# Patient Record
Sex: Male | Born: 1990 | Race: White | Hispanic: No | Marital: Single | Smoking: Never smoker
Health system: Southern US, Community
[De-identification: ages and names within clinical notes are randomized; demographics above are authoritative.]

## PROBLEM LIST (undated history)

## (undated) DIAGNOSIS — T7840XA Allergy, unspecified, initial encounter: Secondary | ICD-10-CM

## (undated) DIAGNOSIS — J45909 Unspecified asthma, uncomplicated: Secondary | ICD-10-CM

## (undated) DIAGNOSIS — H269 Unspecified cataract: Secondary | ICD-10-CM

## (undated) HISTORY — DX: Allergy, unspecified, initial encounter: T78.40XA

## (undated) HISTORY — DX: Unspecified cataract: H26.9

## (undated) HISTORY — PX: APPENDECTOMY: SHX54

---

## 2009-08-11 ENCOUNTER — Emergency Department (HOSPITAL_COMMUNITY): Admission: EM | Admit: 2009-08-11 | Discharge: 2009-08-12 | Payer: Self-pay | Admitting: Emergency Medicine

## 2013-10-11 ENCOUNTER — Encounter (HOSPITAL_COMMUNITY): Payer: Self-pay | Admitting: Emergency Medicine

## 2013-10-11 ENCOUNTER — Emergency Department (HOSPITAL_COMMUNITY)
Admission: EM | Admit: 2013-10-11 | Discharge: 2013-10-11 | Disposition: A | Discharge status: Home / Self Care | Payer: Worker's Compensation | Attending: Emergency Medicine | Admitting: Emergency Medicine

## 2013-10-11 DIAGNOSIS — S0093XA Contusion of unspecified part of head, initial encounter: Secondary | ICD-10-CM

## 2013-10-11 DIAGNOSIS — Y9389 Activity, other specified: Secondary | ICD-10-CM | POA: Diagnosis not present

## 2013-10-11 DIAGNOSIS — Z79899 Other long term (current) drug therapy: Secondary | ICD-10-CM | POA: Insufficient documentation

## 2013-10-11 DIAGNOSIS — J45909 Unspecified asthma, uncomplicated: Secondary | ICD-10-CM | POA: Diagnosis not present

## 2013-10-11 DIAGNOSIS — S0990XA Unspecified injury of head, initial encounter: Secondary | ICD-10-CM | POA: Insufficient documentation

## 2013-10-11 DIAGNOSIS — S0083XA Contusion of other part of head, initial encounter: Secondary | ICD-10-CM | POA: Insufficient documentation

## 2013-10-11 DIAGNOSIS — S1093XA Contusion of unspecified part of neck, initial encounter: Secondary | ICD-10-CM

## 2013-10-11 DIAGNOSIS — S0003XA Contusion of scalp, initial encounter: Secondary | ICD-10-CM | POA: Diagnosis not present

## 2013-10-11 DIAGNOSIS — S060X0A Concussion without loss of consciousness, initial encounter: Secondary | ICD-10-CM | POA: Insufficient documentation

## 2013-10-11 DIAGNOSIS — Y929 Unspecified place or not applicable: Secondary | ICD-10-CM | POA: Diagnosis not present

## 2013-10-11 DIAGNOSIS — IMO0002 Reserved for concepts with insufficient information to code with codable children: Secondary | ICD-10-CM | POA: Insufficient documentation

## 2013-10-11 DIAGNOSIS — G44319 Acute post-traumatic headache, not intractable: Secondary | ICD-10-CM

## 2013-10-11 HISTORY — DX: Unspecified asthma, uncomplicated: J45.909

## 2013-10-11 MED ORDER — IBUPROFEN 400 MG PO TABS
800.0000 mg | ORAL_TABLET | Freq: Once | ORAL | Status: AC
  Administered 2013-10-11: 800 mg via ORAL
  Filled 2013-10-11: qty 2

## 2013-10-11 MED ORDER — IBUPROFEN 800 MG PO TABS: 800.0000 mg | ORAL_TABLET | Freq: Three times a day (TID) | ORAL | Status: DC

## 2013-10-11 NOTE — ED Provider Notes (Signed)
CSN: [id number]     Arrival date & time 10/11/13  1921 History  This chart was scribed for non-physician practitioner, Dierdre Forth PA-C, working with Toy Baker, MD by Milly Jakob, ED Scribe. The patient was seen in room TR09C/TR09C. Patient's care was started at 8:20 PM.   Chief Complaint  Patient presents with  . Head Injury   The history is provided by the patient and medical records. No language interpreter was used.   HPI Comments: Don Obrien is a 23 y.o. male who presents to the Emergency Department complaining of a head injury earlier tonight. He states that he was working on a car, and he stood up and slammed his head into the spoiler. He states that it knocked him to the ground, but he did not pass out. He reports that he has pain at the site of the injury that a headache that radiates across his head. He reports that he feels "cloudy" but has had no memory loss.  He denies neck pain, blurry vision, numbness or tingling. He did not take any medication for his pain. He reports that he has a high pain tolerance. He denies any medical problems or taking any medications.   Past Medical History  Diagnosis Date  . Asthma     as child   Past Surgical History  Procedure Laterality Date  . Appendectomy     No family history on file. History  Substance Use Topics  . Smoking status: Never Smoker   . Smokeless tobacco: Not on file  . Alcohol Use: Yes     Comment: occasionally    Review of Systems  Constitutional: Negative for fever, diaphoresis, appetite change, fatigue and unexpected weight change.  HENT: Negative for mouth sores.   Eyes: Negative for visual disturbance.  Respiratory: Negative for cough, chest tightness, shortness of breath and wheezing.   Cardiovascular: Negative for chest pain.  Gastrointestinal: Negative for nausea, vomiting, abdominal pain, diarrhea and constipation.  Endocrine: Negative for polydipsia, polyphagia and polyuria.   Genitourinary: Negative for dysuria, urgency, frequency and hematuria.  Musculoskeletal: Negative for back pain, neck pain and neck stiffness.  Skin: Negative for rash.  Allergic/Immunologic: Negative for immunocompromised state.  Neurological: Positive for headaches. Negative for syncope and light-headedness.  Hematological: Does not bruise/bleed easily.  Psychiatric/Behavioral: Negative for sleep disturbance. The patient is not nervous/anxious.    Allergies  Review of patient's allergies indicates no known allergies.  Home Medications   Prior to Admission medications   Medication Sig Start Date End Date Taking? Authorizing Provider  ibuprofen (ADVIL,MOTRIN) 800 MG tablet Take 1 tablet (800 mg total) by mouth 3 (three) times daily. 10/11/13   Nadeen Shipman, PA-C   Triage Vitals: BP 131/77  Pulse 65  Temp(Src) 97.6 F (36.4 C) (Oral)  Resp 18  Ht 6' (1.829 m)  Wt 158 lb (71.668 kg)  BMI 21.42 kg/m2  SpO2 97% Physical Exam  Nursing note and vitals reviewed. Constitutional: He is oriented to person, place, and time. He appears well-developed and well-nourished. No distress.  HENT:  Head: Normocephalic and atraumatic.  Right Ear: Tympanic membrane, external ear and ear canal normal.  Left Ear: Tympanic membrane, external ear and ear canal normal.  Nose: Nose normal. No epistaxis. Right sinus exhibits no maxillary sinus tenderness and no frontal sinus tenderness. Left sinus exhibits no maxillary sinus tenderness and no frontal sinus tenderness.  Mouth/Throat: Uvula is midline, oropharynx is clear and moist and mucous membranes are normal. Mucous membranes are  not pale and not cyanotic. No oropharyngeal exudate, posterior oropharyngeal edema, posterior oropharyngeal erythema or tonsillar abscesses.  4CM linear contusion to the right forehead at the hairline. No crepitus, deformity, or laceration.   Eyes: Conjunctivae and EOM are normal. Pupils are equal, round, and reactive to  light. No scleral icterus.  No horizontal, vertical or rotational nystagmus. Anisocoria present w/ briskly reactive pupils. EOMI w/o diplopia.   Neck: Normal range of motion and full passive range of motion without pain. Neck supple.  Full active and passive ROM without pain No midline or paraspinal tenderness No nuchal rigidity or meningeal signs  Cardiovascular: Normal rate, regular rhythm, normal heart sounds and intact distal pulses.   No murmur heard. Pulmonary/Chest: Effort normal and breath sounds normal. No stridor. No respiratory distress. He has no wheezes. He has no rales.  Clear and equal breath sounds without focal wheezes, rhonchi, rales  Abdominal: Soft. Bowel sounds are normal. There is no tenderness. There is no rebound and no guarding.  Musculoskeletal: Normal range of motion. He exhibits no edema.  Lymphadenopathy:    He has no cervical adenopathy.  Neurological: He is alert and oriented to person, place, and time. He has normal reflexes. No cranial nerve deficit. He exhibits normal muscle tone. Coordination normal.  Mental Status:  Alert, oriented, thought content appropriate. Speech fluent without evidence of aphasia. Able to follow 2 step commands without difficulty.  Cranial Nerves:  II:  Peripheral visual fields grossly normal, pupils equal, round, reactive to light III,IV, VI: ptosis not present, extra-ocular motions intact bilaterally  V,VII: smile symmetric, facial light touch sensation equal VIII: hearing grossly normal bilaterally  IX,X: gag reflex present  XI: bilateral shoulder shrug equal and strong XII: midline tongue extension  Motor:  5/5 in upper and lower extremities bilaterally including strong and equal grip strength and dorsiflexion/plantar flexion Sensory: Pinprick and light touch normal in all extremities.  Deep Tendon Reflexes: 2+ and symmetric  Cerebellar: normal finger-to-nose with bilateral upper extremities Gait: normal gait and  balance CV: distal pulses palpable throughout   Skin: Skin is warm and dry. No rash noted. He is not diaphoretic.  Psychiatric: He has a normal mood and affect. His behavior is normal. Judgment and thought content normal.    ED Course  Procedures (including critical care time) DIAGNOSTIC STUDIES: Oxygen Saturation is 97% on room air, normal by my interpretation.    COORDINATION OF CARE: 8:27 PM-Discussed treatment plan which includes NSAIDs, and rest from contact sports and electronic devices with pt at bedside and pt agreed to plan.    Labs Review Labs Reviewed - No data to display  Imaging Review No results found.   EKG Interpretation None      MDM   Final diagnoses:  Contusion of head, initial encounter  Acute post-traumatic headache, not intractable  Concussion, without loss of consciousness, initial encounter   Don Obrien presents with headache persistent after hitting his head on the spoiler of a car approximately 4 PM today.    Patient with head injury which did not cause of loss of consciousness but with persistent headache since the initial trauma.  No evidence of skull fracture on physical exam. Patient is not taking anticoagulants, is less than 65 and has no history of subarachnoid or subdural hemorrhage. Patient denies nausea, vomiting, amnesia, vision changes,cognitive or memory dysfunction and vertigo.    Patient with no focal neurological deficits on physical exam.  Discussed thoroughly symptoms to return to the emergency department including  severe headaches, disequilibrium, vomiting, double vision, extremity weakness, difficulty ambulating, or any other concerning symptoms.  Discussed the likely etiology of patient's symptoms being concussion/postconcussive syndrome and patient agrees that CT is not indicated at this time.  Patient will be discharged with information pertaining to diagnosis and advised to use over-the-counter medications like NSAIDs and Tylenol  for pain relief. Pt has also advised to not participate in contact sports until they are completely asymptomatic for at least 1 week or they are cleared by their doctor.   I have personally reviewed patient's vitals, nursing note and any pertinent labs or imaging.  I performed an undressed physical exam.    At this time, it has been determined that no acute conditions requiring further emergency intervention. The patient/guardian have been advised of the diagnosis and plan. I reviewed all labs and imaging including any potential incidental findings. We have discussed signs and symptoms that warrant return to the ED, such as those listed above.  Patient/guardian has voiced understanding and agreed to follow-up with the PCP or specialist in 3 days.  Vital signs are stable at discharge.   BP 132/74  Pulse 59  Temp(Src) 97.6 F (36.4 C) (Oral)  Resp 12  Ht 6' (1.829 m)  Wt 158 lb (71.668 kg)  BMI 21.42 kg/m2  SpO2 100%  I personally performed the services described in this documentation, which was scribed in my presence. The recorded information has been reviewed and is accurate.          Dahlia ClientHannah Rakhi Romagnoli, PA-C 10/11/13 2153

## 2013-10-11 NOTE — ED Notes (Signed)
Pt reports he was at work and was under car, and hit R side of head on spoiler when he jumped up; denies LOC

## 2013-10-11 NOTE — Discharge Instructions (Signed)
1. Medications: ibuprofen, usual home medications 2. Treatment: rest, drink plenty of fluids,  3. Follow Up: Please followup with your primary doctor for discussion of your diagnoses and further evaluation after today's visit; if you do not have a primary care doctor use the resource guide provided to find one;    Concussion A concussion, or closed-head injury, is a brain injury caused by a direct blow to the head or by a quick and sudden movement (jolt) of the head or neck. Concussions are usually not life-threatening. Even so, the effects of a concussion can be serious. If you have had a concussion before, you are more likely to experience concussion-like symptoms after a direct blow to the head.  CAUSES  Direct blow to the head, such as from running into another player during a soccer game, being hit in a fight, or hitting your head on a hard surface.  A jolt of the head or neck that causes the brain to move back and forth inside the skull, such as in a car crash. SIGNS AND SYMPTOMS The signs of a concussion can be hard to notice. Early on, they may be missed by you, family members, and health care providers. You may look fine but act or feel differently. Symptoms are usually temporary, but they may last for days, weeks, or even longer. Some symptoms may appear right away while others may not show up for hours or days. Every head injury is different. Symptoms include:  Mild to moderate headaches that will not go away.  A feeling of pressure inside your head.  Having more trouble than usual:  Learning or remembering things you have heard.  Answering questions.  Paying attention or concentrating.  Organizing daily tasks.  Making decisions and solving problems.  Slowness in thinking, acting or reacting, speaking, or reading.  Getting lost or being easily confused.  Feeling tired all the time or lacking energy (fatigued).  Feeling drowsy.  Sleep disturbances.  Sleeping more  than usual.  Sleeping less than usual.  Trouble falling asleep.  Trouble sleeping (insomnia).  Loss of balance or feeling lightheaded or dizzy.  Nausea or vomiting.  Numbness or tingling.  Increased sensitivity to:  Sounds.  Lights.  Distractions.  Vision problems or eyes that tire easily.  Diminished sense of taste or smell.  Ringing in the ears.  Mood changes such as feeling sad or anxious.  Becoming easily irritated or angry for little or no reason.  Lack of motivation.  Seeing or hearing things other people do not see or hear (hallucinations). DIAGNOSIS Your health care provider can usually diagnose a concussion based on a description of your injury and symptoms. He or she will ask whether you passed out (lost consciousness) and whether you are having trouble remembering events that happened right before and during your injury. Your evaluation might include:  A brain scan to look for signs of injury to the brain. Even if the test shows no injury, you may still have a concussion.  Blood tests to be sure other problems are not present. TREATMENT  Concussions are usually treated in an emergency department, in urgent care, or at a clinic. You may need to stay in the hospital overnight for further treatment.  Tell your health care provider if you are taking any medicines, including prescription medicines, over-the-counter medicines, and natural remedies. Some medicines, such as blood thinners (anticoagulants) and aspirin, may increase the chance of complications. Also tell your health care provider whether you have had  alcohol or are taking illegal drugs. This information may affect treatment.  Your health care provider will send you home with important instructions to follow.  How fast you will recover from a concussion depends on many factors. These factors include how severe your concussion is, what part of your brain was injured, your age, and how healthy you were  before the concussion.  Most people with mild injuries recover fully. Recovery can take time. In general, recovery is slower in older persons. Also, persons who have had a concussion in the past or have other medical problems may find that it takes longer to recover from their current injury. HOME CARE INSTRUCTIONS General Instructions  Carefully follow the directions your health care provider gave you.  Only take over-the-counter or prescription medicines for pain, discomfort, or fever as directed by your health care provider.  Take only those medicines that your health care provider has approved.  Do not drink alcohol until your health care provider says you are well enough to do so. Alcohol and certain other drugs may slow your recovery and can put you at risk of further injury.  If it is harder than usual to remember things, write them down.  If you are easily distracted, try to do one thing at a time. For example, do not try to watch TV while fixing dinner.  Talk with family members or close friends when making important decisions.  Keep all follow-up appointments. Repeated evaluation of your symptoms is recommended for your recovery.  Watch your symptoms and tell others to do the same. Complications sometimes occur after a concussion. Older adults with a brain injury may have a higher risk of serious complications, such as a blood clot on the brain.  Tell your teachers, school nurse, school counselor, coach, athletic trainer, or work Production designer, theatre/television/film about your injury, symptoms, and restrictions. Tell them about what you can or cannot do. They should watch for:  Increased problems with attention or concentration.  Increased difficulty remembering or learning new information.  Increased time needed to complete tasks or assignments.  Increased irritability or decreased ability to cope with stress.  Increased symptoms.  Rest. Rest helps the brain to heal. Make sure you:  Get plenty of  sleep at night. Avoid staying up late at night.  Keep the same bedtime hours on weekends and weekdays.  Rest during the day. Take daytime naps or rest breaks when you feel tired.  Limit activities that require a lot of thought or concentration. These include:  Doing homework or job-related work.  Watching TV.  Working on the computer.  Avoid any situation where there is potential for another head injury (football, hockey, soccer, basketball, martial arts, downhill snow sports and horseback riding). Your condition will get worse every time you experience a concussion. You should avoid these activities until you are evaluated by the appropriate follow-up health care providers. Returning To Your Regular Activities You will need to return to your normal activities slowly, not all at once. You must give your body and brain enough time for recovery.  Do not return to sports or other athletic activities until your health care provider tells you it is safe to do so.  Ask your health care provider when you can drive, ride a bicycle, or operate heavy machinery. Your ability to react may be slower after a brain injury. Never do these activities if you are dizzy.  Ask your health care provider about when you can return to work or school.  Preventing Another Concussion It is very important to avoid another brain injury, especially before you have recovered. In rare cases, another injury can lead to permanent brain damage, brain swelling, or death. The risk of this is greatest during the first 7-10 days after a head injury. Avoid injuries by:  Wearing a seat belt when riding in a car.  Drinking alcohol only in moderation.  Wearing a helmet when biking, skiing, skateboarding, skating, or doing similar activities.  Avoiding activities that could lead to a second concussion, such as contact or recreational sports, until your health care provider says it is okay.  Taking safety measures in your  home.  Remove clutter and tripping hazards from floors and stairways.  Use grab bars in bathrooms and handrails by stairs.  Place non-slip mats on floors and in bathtubs.  Improve lighting in dim areas. SEEK MEDICAL CARE IF:  You have increased problems paying attention or concentrating.  You have increased difficulty remembering or learning new information.  You need more time to complete tasks or assignments than before.  You have increased irritability or decreased ability to cope with stress.  You have more symptoms than before. Seek medical care if you have any of the following symptoms for more than 2 weeks after your injury:  Lasting (chronic) headaches.  Dizziness or balance problems.  Nausea.  Vision problems.  Increased sensitivity to noise or light.  Depression or mood swings.  Anxiety or irritability.  Memory problems.  Difficulty concentrating or paying attention.  Sleep problems.  Feeling tired all the time. SEEK IMMEDIATE MEDICAL CARE IF:  You have severe or worsening headaches. These may be a sign of a blood clot in the brain.  You have weakness (even if only in one hand, leg, or part of the face).  You have numbness.  You have decreased coordination.  You vomit repeatedly.  You have increased sleepiness.  One pupil is larger than the other.  You have convulsions.  You have slurred speech.  You have increased confusion. This may be a sign of a blood clot in the brain.  You have increased restlessness, agitation, or irritability.  You are unable to recognize people or places.  You have neck pain.  It is difficult to wake you up.  You have unusual behavior changes.  You lose consciousness. MAKE SURE YOU:  Understand these instructions.  Will watch your condition.  Will get help right away if you are not doing well or get worse. Document Released: 05/02/2003 Document Revised: 02/14/2013 Document Reviewed:  09/01/2012 [facility] Patient Information 2015 [place], [place]. This information is not intended to replace advice given to you by your health care provider. Make sure you discuss any questions you have with your health care provider.   Post-Concussion Syndrome Post-concussion syndrome describes the symptoms that can occur after a head injury. These symptoms can last from weeks to months. CAUSES  It is not clear why some head injuries cause post-concussion syndrome. It can occur whether your head injury was mild or severe and whether you were wearing head protection or not.  SIGNS AND SYMPTOMS  Memory difficulties.  Dizziness.  Headaches.  Double vision or blurry vision.  Sensitivity to light.  Hearing difficulties.  Depression.  Tiredness.  Weakness.  Difficulty with concentration.  Difficulty sleeping or staying asleep.  Vomiting.  Poor balance or instability on your feet.  Slow reaction time.  Difficulty learning and remembering things you have heard. DIAGNOSIS  There is no test to determine  whether you have post-concussion syndrome. Your health care provider may order an imaging scan of your brain, such as a CT scan, to check for other problems that may be causing your symptoms (such as severe injury inside your skull). TREATMENT  Usually, these problems disappear over time without medical care. Your health care provider may prescribe medicine to help ease your symptoms. It is important to follow up with a neurologist to evaluate your recovery and address any lingering symptoms or issues. HOME CARE INSTRUCTIONS   Only take over-the-counter or prescription medicines for pain, discomfort, or fever as directed by your health care provider. Do not take aspirin. Aspirin can slow blood clotting.  Sleep with your head slightly elevated to help with headaches.  Avoid any situation where there is potential for another head injury (football, hockey, soccer, basketball,  martial arts, downhill snow sports, and horseback riding). Your condition will get worse every time you experience a concussion. You should avoid these activities until you are evaluated by the appropriate follow-up health care providers.  Keep all follow-up appointments as directed by your health care provider. SEEK IMMEDIATE MEDICAL CARE IF:  You develop confusion or unusual drowsiness.  You cannot wake the injured person.  You develop nausea or persistent, forceful vomiting.  You feel like you are moving when you are not (vertigo).  You notice the injured person's eyes moving rapidly back and forth. This may be a sign of vertigo.  You have convulsions or faint.  You have severe, persistent headaches that are not relieved by medicine.  You cannot use your arms or legs normally.  Your pupils change size.  You have clear or bloody discharge from the nose or ears.  Your problems are getting worse, not better. MAKE SURE YOU:  Understand these instructions.  Will watch your condition.  Will get help right away if you are not doing well or get worse. Document Released: 08/01/2001 Document Revised: 11/30/2012 Document Reviewed: 05/17/2013 [facility] Patient Information 2015 [place], [place]. This information is not intended to replace advice given to you by your health care provider. Make sure you discuss any questions you have with your health care provider.

## 2013-10-11 NOTE — ED Notes (Signed)
Declined W/C at D/C and was escorted to lobby by RN. 
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
## 2013-10-11 NOTE — ED Notes (Signed)
Phlebotomy notified for workers comp

## 2013-10-12 NOTE — ED Provider Notes (Signed)
Medical screening examination/treatment/procedure(s) were performed by non-physician practitioner and as supervising physician I was immediately available for consultation/collaboration.   Raheim Beutler T Giovoni Bunch, MD 10/12/13 2250 

## 2013-10-16 ENCOUNTER — Encounter (HOSPITAL_COMMUNITY): Payer: Self-pay | Admitting: Emergency Medicine

## 2013-10-16 DIAGNOSIS — J45909 Unspecified asthma, uncomplicated: Secondary | ICD-10-CM | POA: Diagnosis not present

## 2013-10-16 DIAGNOSIS — Z87828 Personal history of other (healed) physical injury and trauma: Secondary | ICD-10-CM | POA: Diagnosis not present

## 2013-10-16 DIAGNOSIS — R51 Headache: Secondary | ICD-10-CM | POA: Diagnosis present

## 2013-10-16 DIAGNOSIS — Z791 Long term (current) use of non-steroidal anti-inflammatories (NSAID): Secondary | ICD-10-CM | POA: Diagnosis not present

## 2013-10-16 DIAGNOSIS — M436 Torticollis: Secondary | ICD-10-CM | POA: Insufficient documentation

## 2013-10-16 DIAGNOSIS — R110 Nausea: Secondary | ICD-10-CM | POA: Diagnosis not present

## 2013-10-16 DIAGNOSIS — F0781 Postconcussional syndrome: Secondary | ICD-10-CM | POA: Insufficient documentation

## 2013-10-16 NOTE — ED Notes (Signed)
Pt seen here Wednesday and dx with minor concussion. States since discharge he has felt "a little dizzy sometimes" and reports 5/10 HA. Denies vision changes. PERRLA, 3 mm. Pt in NAD. AO x4. Denies taking anything for HA.

## 2013-10-17 ENCOUNTER — Encounter (HOSPITAL_COMMUNITY): Payer: Self-pay

## 2013-10-17 ENCOUNTER — Emergency Department (HOSPITAL_COMMUNITY)
Admission: EM | Admit: 2013-10-17 | Discharge: 2013-10-17 | Disposition: A | Discharge status: Home / Self Care | Payer: Worker's Compensation | Attending: Emergency Medicine | Admitting: Emergency Medicine

## 2013-10-17 ENCOUNTER — Emergency Department (HOSPITAL_COMMUNITY): Payer: Worker's Compensation

## 2013-10-17 DIAGNOSIS — F0781 Postconcussional syndrome: Secondary | ICD-10-CM

## 2013-10-17 NOTE — Discharge Instructions (Signed)
Concussion  A concussion, or closed-head injury, is a brain injury caused by a direct blow to the head or by a quick and sudden movement (jolt) of the head or neck. Concussions are usually not life-threatening. Even so, the effects of a concussion can be serious. If you have had a concussion before, you are more likely to experience concussion-like symptoms after a direct blow to the head.   CAUSES  · Direct blow to the head, such as from running into another player during a soccer game, being hit in a fight, or hitting your head on a hard surface.  · A jolt of the head or neck that causes the brain to move back and forth inside the skull, such as in a car crash.  SIGNS AND SYMPTOMS  The signs of a concussion can be hard to notice. Early on, they may be missed by you, family members, and health care providers. You may look fine but act or feel differently.  Symptoms are usually temporary, but they may last for days, weeks, or even longer. Some symptoms may appear right away while others may not show up for hours or days. Every head injury is different. Symptoms include:  · Mild to moderate headaches that will not go away.  · A feeling of pressure inside your head.  · Having more trouble than usual:  ¨ Learning or remembering things you have heard.  ¨ Answering questions.  ¨ Paying attention or concentrating.  ¨ Organizing daily tasks.  ¨ Making decisions and solving problems.  · Slowness in thinking, acting or reacting, speaking, or reading.  · Getting lost or being easily confused.  · Feeling tired all the time or lacking energy (fatigued).  · Feeling drowsy.  · Sleep disturbances.  ¨ Sleeping more than usual.  ¨ Sleeping less than usual.  ¨ Trouble falling asleep.  ¨ Trouble sleeping (insomnia).  · Loss of balance or feeling lightheaded or dizzy.  · Nausea or vomiting.  · Numbness or tingling.  · Increased sensitivity to:  ¨ Sounds.  ¨ Lights.  ¨ Distractions.  · Vision problems or eyes that tire  easily.  · Diminished sense of taste or smell.  · Ringing in the ears.  · Mood changes such as feeling sad or anxious.  · Becoming easily irritated or angry for little or no reason.  · Lack of motivation.  · Seeing or hearing things other people do not see or hear (hallucinations).  DIAGNOSIS  Your health care provider can usually diagnose a concussion based on a description of your injury and symptoms. He or she will ask whether you passed out (lost consciousness) and whether you are having trouble remembering events that happened right before and during your injury.  Your evaluation might include:  · A brain scan to look for signs of injury to the brain. Even if the test shows no injury, you may still have a concussion.  · Blood tests to be sure other problems are not present.  TREATMENT  · Concussions are usually treated in an emergency department, in urgent care, or at a clinic. You may need to stay in the hospital overnight for further treatment.  · Tell your health care provider if you are taking any medicines, including prescription medicines, over-the-counter medicines, and natural remedies. Some medicines, such as blood thinners (anticoagulants) and aspirin, may increase the chance of complications. Also tell your health care provider whether you have had alcohol or are taking illegal drugs. This information   may affect treatment.  · Your health care provider will send you home with important instructions to follow.  · How fast you will recover from a concussion depends on many factors. These factors include how severe your concussion is, what part of your brain was injured, your age, and how healthy you were before the concussion.  · Most people with mild injuries recover fully. Recovery can take time. In general, recovery is slower in older persons. Also, persons who have had a concussion in the past or have other medical problems may find that it takes longer to recover from their current injury.  HOME  CARE INSTRUCTIONS  General Instructions  · Carefully follow the directions your health care provider gave you.  · Only take over-the-counter or prescription medicines for pain, discomfort, or fever as directed by your health care provider.  · Take only those medicines that your health care provider has approved.  · Do not drink alcohol until your health care provider says you are well enough to do so. Alcohol and certain other drugs may slow your recovery and can put you at risk of further injury.  · If it is harder than usual to remember things, write them down.  · If you are easily distracted, try to do one thing at a time. For example, do not try to watch TV while fixing dinner.  · Talk with family members or close friends when making important decisions.  · Keep all follow-up appointments. Repeated evaluation of your symptoms is recommended for your recovery.  · Watch your symptoms and tell others to do the same. Complications sometimes occur after a concussion. Older adults with a brain injury may have a higher risk of serious complications, such as a blood clot on the brain.  · Tell your teachers, school nurse, school counselor, coach, athletic trainer, or work manager about your injury, symptoms, and restrictions. Tell them about what you can or cannot do. They should watch for:  ¨ Increased problems with attention or concentration.  ¨ Increased difficulty remembering or learning new information.  ¨ Increased time needed to complete tasks or assignments.  ¨ Increased irritability or decreased ability to cope with stress.  ¨ Increased symptoms.  · Rest. Rest helps the brain to heal. Make sure you:  ¨ Get plenty of sleep at night. Avoid staying up late at night.  ¨ Keep the same bedtime hours on weekends and weekdays.  ¨ Rest during the day. Take daytime naps or rest breaks when you feel tired.  · Limit activities that require a lot of thought or concentration. These include:  ¨ Doing homework or job-related  work.  ¨ Watching TV.  ¨ Working on the computer.  · Avoid any situation where there is potential for another head injury (football, hockey, soccer, basketball, martial arts, downhill snow sports and horseback riding). Your condition will get worse every time you experience a concussion. You should avoid these activities until you are evaluated by the appropriate follow-up health care providers.  Returning To Your Regular Activities  You will need to return to your normal activities slowly, not all at once. You must give your body and brain enough time for recovery.  · Do not return to sports or other athletic activities until your health care provider tells you it is safe to do so.  · Ask your health care provider when you can drive, ride a bicycle, or operate heavy machinery. Your ability to react may be slower after a   brain injury. Never do these activities if you are dizzy.  · Ask your health care provider about when you can return to work or school.  Preventing Another Concussion  It is very important to avoid another brain injury, especially before you have recovered. In rare cases, another injury can lead to permanent brain damage, brain swelling, or death. The risk of this is greatest during the first 7-10 days after a head injury. Avoid injuries by:  · Wearing a seat belt when riding in a car.  · Drinking alcohol only in moderation.  · Wearing a helmet when biking, skiing, skateboarding, skating, or doing similar activities.  · Avoiding activities that could lead to a second concussion, such as contact or recreational sports, until your health care provider says it is okay.  · Taking safety measures in your home.  ¨ Remove clutter and tripping hazards from floors and stairways.  ¨ Use grab bars in bathrooms and handrails by stairs.  ¨ Place non-slip mats on floors and in bathtubs.  ¨ Improve lighting in dim areas.  SEEK MEDICAL CARE IF:  · You have increased problems paying attention or  concentrating.  · You have increased difficulty remembering or learning new information.  · You need more time to complete tasks or assignments than before.  · You have increased irritability or decreased ability to cope with stress.  · You have more symptoms than before.  Seek medical care if you have any of the following symptoms for more than 2 weeks after your injury:  · Lasting (chronic) headaches.  · Dizziness or balance problems.  · Nausea.  · Vision problems.  · Increased sensitivity to noise or light.  · Depression or mood swings.  · Anxiety or irritability.  · Memory problems.  · Difficulty concentrating or paying attention.  · Sleep problems.  · Feeling tired all the time.  SEEK IMMEDIATE MEDICAL CARE IF:  · You have severe or worsening headaches. These may be a sign of a blood clot in the brain.  · You have weakness (even if only in one hand, leg, or part of the face).  · You have numbness.  · You have decreased coordination.  · You vomit repeatedly.  · You have increased sleepiness.  · One pupil is larger than the other.  · You have convulsions.  · You have slurred speech.  · You have increased confusion. This may be a sign of a blood clot in the brain.  · You have increased restlessness, agitation, or irritability.  · You are unable to recognize people or places.  · You have neck pain.  · It is difficult to wake you up.  · You have unusual behavior changes.  · You lose consciousness.  MAKE SURE YOU:  · Understand these instructions.  · Will watch your condition.  · Will get help right away if you are not doing well or get worse.  Document Released: 05/02/2003 Document Revised: 02/14/2013 Document Reviewed: 09/01/2012  Exit[employer]® Patient Information ©2015 [employer]. This information is not intended to replace advice given to you by your health care provider. Make sure you discuss any questions you have with your health care provider.

## 2013-10-17 NOTE — ED Notes (Signed)
Pt placed into gown and on monitor upon arrival to room. Pt monitored by 5 lead, blood pressure, and pulse ox.  

## 2013-10-17 NOTE — ED Provider Notes (Signed)
CSN: [id number]     Arrival date & time 10/16/13  2017 History   First MD Initiated Contact with Patient 10/17/13 0041     Chief Complaint  Patient presents with  . Headache  . Follow-up     (Consider location/radiation/quality/duration/timing/severity/associated sxs/prior Treatment) HPI Comments: Patient presents with ongoing headaches, dizziness, nausea since head injury on the 19th. He was seen here and evaluated. Denies any new injury. Denies any episode of vomiting or fever. No visual change. He endorses episodes of dizziness described as lightheadedness that lasts several minutes at a time. He has right-sided headaches that come and go lasting several hours at a time. Denies any vomiting. His wife has noticed issues with memory as well. He is to take ibuprofen with good relief. No focal weakness, numbness or tingling. No neck pain or stiffness. No other injuries.  The history is provided by the patient.    Past Medical History  Diagnosis Date  . Asthma     as child   Past Surgical History  Procedure Laterality Date  . Appendectomy     No family history on file. History  Substance Use Topics  . Smoking status: Never Smoker   . Smokeless tobacco: Not on file  . Alcohol Use: Yes     Comment: occasionally    Review of Systems  Constitutional: Negative for fever, activity change and appetite change.  HENT: Negative for congestion and rhinorrhea.   Eyes: Negative for photophobia and visual disturbance.  Respiratory: Negative for cough, chest tightness and shortness of breath.   Cardiovascular: Negative for chest pain.  Gastrointestinal: Positive for nausea. Negative for vomiting and abdominal pain.  Genitourinary: Negative for dysuria and hematuria.  Musculoskeletal: Positive for neck stiffness. Negative for back pain and neck pain.  Skin: Negative for wound.  Neurological: Positive for dizziness, light-headedness and headaches. Negative for weakness and numbness.  A  complete 10 system review of systems was obtained and all systems are negative except as noted in the HPI and PMH.      Allergies  Review of patient's allergies indicates no known allergies.  Home Medications   Prior to Admission medications   Medication Sig Start Date End Date Taking? Authorizing Provider  ibuprofen (ADVIL,MOTRIN) 800 MG tablet Take 1 tablet (800 mg total) by mouth 3 (three) times daily. 10/11/13   Hannah Muthersbaugh, PA-C   BP 112/94  Pulse 56  Temp(Src) 97.6 F (36.4 C) (Oral)  Resp 16  SpO2 94% Physical Exam  Nursing note and vitals reviewed. Constitutional: He is oriented to person, place, and time. He appears well-developed and well-nourished. No distress.  HENT:  Head: Normocephalic and atraumatic.  Mouth/Throat: Oropharynx is clear and moist. No oropharyngeal exudate.  Eyes: Conjunctivae and EOM are normal. Pupils are equal, round, and reactive to light.  Neck: Normal range of motion. Neck supple.  No meningismus.  Cardiovascular: Normal rate, regular rhythm, normal heart sounds and intact distal pulses.   No murmur heard. Pulmonary/Chest: Effort normal and breath sounds normal. No respiratory distress.  Abdominal: Soft. There is no tenderness. There is no rebound and no guarding.  Musculoskeletal: Normal range of motion. He exhibits no edema and no tenderness.  Neurological: He is alert and oriented to person, place, and time. No cranial nerve deficit. He exhibits normal muscle tone. Coordination normal.  No ataxia on finger to nose bilaterally. No pronator drift. 5/5 strength throughout. CN 2-12 intact. Negative Romberg. Equal grip strength. Sensation intact. Gait is normal.   Skin:  Skin is warm.  Psychiatric: He has a normal mood and affect. His behavior is normal.    ED Course  Procedures (including critical care time) Labs Review Labs Reviewed - No data to display  Imaging Review Ct Head Wo Contrast  10/17/2013   CLINICAL DATA:  Headache.   EXAM: CT HEAD WITHOUT CONTRAST  TECHNIQUE: Contiguous axial images were obtained from the base of the skull through the vertex without intravenous contrast.  COMPARISON:  None.  FINDINGS: The ventricles and sulci are normal. No intraparenchymal hemorrhage, mass effect nor midline shift. No acute large vascular territory infarcts.  No abnormal extra-axial fluid collections. Basal cisterns are patent.  No skull fracture. The included ocular globes and orbital contents are non-suspicious. The mastoid aircells and included paranasal sinuses are well-aerated.  IMPRESSION: No acute intracranial process ; normal noncontrast CT of the head.   Electronically Signed   By: Awilda Metro   On: 10/17/2013 01:20     EKG Interpretation None      MDM   Final diagnoses:  Postconcussive syndrome   Patient with recent diagnosis of concussion presenting with postconcussive headaches, dizziness and nausea. Neurologically intact. Appears very well in no distress.  Given the persistent symptoms, CT scan obtained. Negative for acute pathology.  Discussed postconcussive syndrome the patient. Advised to refrain from contact sports or participate in activities where he may likely be injured. Continue ibuprofen as needed for headaches at home. Establish care with PCP in followup. Return to ED with worsening headache, vomiting, focal weakness, numbness or tingling, dizziness or any other concerns.  Glynn Octave, MD 10/17/13 [phone]

## 2013-10-26 ENCOUNTER — Encounter (HOSPITAL_COMMUNITY): Payer: Self-pay | Admitting: Emergency Medicine

## 2013-10-26 ENCOUNTER — Emergency Department (HOSPITAL_COMMUNITY)
Admission: EM | Admit: 2013-10-26 | Discharge: 2013-10-26 | Disposition: A | Discharge status: Home / Self Care | Payer: Worker's Compensation | Attending: Emergency Medicine | Admitting: Emergency Medicine

## 2013-10-26 DIAGNOSIS — F0781 Postconcussional syndrome: Secondary | ICD-10-CM | POA: Diagnosis not present

## 2013-10-26 DIAGNOSIS — J45909 Unspecified asthma, uncomplicated: Secondary | ICD-10-CM | POA: Insufficient documentation

## 2013-10-26 DIAGNOSIS — R51 Headache: Secondary | ICD-10-CM | POA: Insufficient documentation

## 2013-10-26 NOTE — ED Provider Notes (Signed)
CSN: [id number]     Arrival date & time 10/26/13  4098 History   First MD Initiated Contact with Patient 10/26/13 [phone]     Chief Complaint  Patient presents with  . Headache     (Consider location/radiation/quality/duration/timing/severity/associated sxs/prior Treatment) Patient is a 23 y.o. male presenting with headaches. The history is provided by the patient.  Headache  His HA returned 2 days ago after a night of drinking alcohol. He also feels that the fingers of both hands are not as dexterous as usual. No N/V, weakness or dizziness. Here recently with a concussion from minor head trauma. Hx concussions, several in the past. There are no other known modifying factors.  Past Medical History  Diagnosis Date  . Asthma     as child   Past Surgical History  Procedure Laterality Date  . Appendectomy     No family history on file. History  Substance Use Topics  . Smoking status: Never Smoker   . Smokeless tobacco: Not on file  . Alcohol Use: Yes     Comment: occasionally    Review of Systems  Neurological: Positive for headaches.  All other systems reviewed and are negative.     Allergies  Review of patient's allergies indicates no known allergies.  Home Medications   Prior to Admission medications   Medication Sig Start Date End Date Taking? Authorizing Provider  naproxen (NAPROSYN) 250 MG tablet Take 500 mg by mouth daily as needed for mild pain.   Yes Historical Provider, MD   BP 120/73  Pulse 63  Temp(Src) 97.8 F (36.6 C) (Oral)  Resp 16  SpO2 99% Physical Exam  Nursing note and vitals reviewed. Constitutional: He is oriented to person, place, and time. He appears well-developed and well-nourished.  HENT:  Head: Normocephalic and atraumatic.  Right Ear: External ear normal.  Left Ear: External ear normal.  Eyes: Conjunctivae and EOM are normal. Pupils are equal, round, and reactive to light.  Neck: Normal range of motion and phonation normal. Neck  supple.  Cardiovascular: Normal rate, regular rhythm, normal heart sounds and intact distal pulses.   Pulmonary/Chest: Effort normal and breath sounds normal. He exhibits no bony tenderness.  Abdominal: Soft. There is no tenderness.  Musculoskeletal: Normal range of motion.  Neurological: He is alert and oriented to person, place, and time. No cranial nerve deficit or sensory deficit. He exhibits normal muscle tone. Coordination normal.  Skin: Skin is warm, dry and intact.  Psychiatric: He has a normal mood and affect. His behavior is normal. Judgment and thought content normal.    ED Course  Procedures (including critical care time)  Medications - No data to display  Patient Vitals for the past 24 hrs:  BP Temp Temp src Pulse Resp SpO2  10/26/13 0711 120/73 mmHg 97.8 F (36.6 C) Oral 63 16 99 %  10/26/13 0642 135/77 mmHg 97.4 F (36.3 C) Oral 83 12 97 %      Labs Review Labs Reviewed - No data to display  Imaging Review No results found.   EKG Interpretation None      MDM   Final diagnoses:  Post concussion syndrome    Mild post-concussive sx. No indication for further evaluation and treatment.  Nursing Notes Reviewed/ Care Coordinated Applicable Imaging Reviewed Interpretation of Laboratory Data incorporated into ED treatment  The patient appears reasonably screened and/or stabilized for discharge and I doubt any other medical condition or other [facility] requiring further screening, evaluation, or treatment in the  ED at this time prior to discharge.  Plan: Home Medications- IBU; Home Treatments- rest; return here if the recommended treatment, does not improve the symptoms; Recommended follow up- PCP or Neurology for follow up care.    Flint Melter, MD 10/26/13 2031

## 2013-10-26 NOTE — Discharge Instructions (Signed)
Post-Concussion Syndrome °Post-concussion syndrome means you have problems after a head injury. The problems can last for weeks or months. The problems usually go away on their own over time. °HOME CARE  °· Only take medicines as told by your doctor. Do not take aspirin. °· Sleep with your head raised (elevated) to help with headaches. °· Avoid activities that can cause another head injury.  Do not play contact sports like football, hockey, soccer or basketball. Do not do other risky activities like downhill skiing or martial arts, or ride horses until your doctor says it is OK. °· Keep all doctor visits as told. °GET HELP RIGHT AWAY IF: °· You feel confused or very sleepy. °· You cannot wake the injured person. °· You feel sick to your stomach (nauseous) or keep throwing up (vomiting). °· You feel like you are moving when you are not (vertigo). °· You notice the injured person's eyes moving back and forth very fast. °· You start shaking (convulsing) or pass out (faint). °· You have very bad headaches that do not get better with medicine. °· You cannot use your arms or legs normally. °· The black centers of your eyes (pupils) change size. °· You have clear or bloody fluid coming from your nose or ears. °· Your problems get worse, not better. °MAKE SURE YOU: °· Understand these instructions. °· Will watch your condition. °· Will get help right away if you are not doing well or get worse. °Document Released: 03/19/2004 Document Revised: 11/30/2012 Document Reviewed: 05/17/2013 °[employer]® Patient Information ©2015 [employer]. This information is not intended to replace advice given to you by your health care provider. Make sure you discuss any questions you have with your health care provider. ° °

## 2013-10-26 NOTE — ED Notes (Signed)
MD at bedside. 

## 2013-10-26 NOTE — ED Notes (Signed)
The pt was diagnosed with a concussion 2 weeks ago.  Since Sunday his headache has returned .  C/o dizziness with sl nausea

## 2014-01-11 ENCOUNTER — Ambulatory Visit (INDEPENDENT_AMBULATORY_CARE_PROVIDER_SITE_OTHER): Payer: BC Managed Care – PPO | Admitting: Family Medicine

## 2014-01-11 VITALS — BP 114/70 | HR 67 | Temp 97.8°F | Resp 18 | Ht 71.0 in | Wt 167.0 lb

## 2014-01-11 DIAGNOSIS — B86 Scabies: Secondary | ICD-10-CM

## 2014-01-11 DIAGNOSIS — T798XXA Other early complications of trauma, initial encounter: Secondary | ICD-10-CM

## 2014-01-11 MED ORDER — PERMETHRIN 5 % EX CREA: 1.0000 "application " | TOPICAL_CREAM | Freq: Once | CUTANEOUS | Status: AC

## 2014-01-11 MED ORDER — CEPHALEXIN 500 MG PO CAPS: 500.0000 mg | ORAL_CAPSULE | Freq: Three times a day (TID) | ORAL | Status: DC

## 2014-01-11 MED ORDER — DOXYCYCLINE HYCLATE 100 MG PO CAPS: 100.0000 mg | ORAL_CAPSULE | Freq: Two times a day (BID) | ORAL | Status: DC

## 2014-01-11 NOTE — Progress Notes (Signed)
Subjective:  This chart was scribed for Nilda SimmerKristi Sian Joles, MD by Evon Slackerrance Branch, ED Scribe. This Patient was seen in room 03 and the patients care was started at 10:40 PM   Patient ID: Don Obrien, male    DOB: May 18, 1990, 23 y.o.   MRN: [id number]  01/11/2014  Recurrent Skin Infections   HPI HPI Comments: Don Obrien is a 23 y.o. male who presents to the [facility] complaining of new rash on the bottom of his left foot onset 2 days ago. He states that the area is painful and itchy and he has associated red streaking. Denies any foreign body.  Denies fever, chills or sweats.   He is also complaining of a rash on his bilateral extremities. He states that he has been in contact with his roommates who has had scabies.   His girlfriend is also currently itching with a rash.    Review of Systems  Constitutional: Negative for fever, chills and diaphoresis.  Skin: Positive for rash.    Past Medical History  Diagnosis Date  . Asthma     as child  . Allergy   . Cataract    Past Surgical History  Procedure Laterality Date  . Appendectomy     No Known Allergies Current Outpatient Prescriptions  Medication Sig Dispense Refill  . cephALEXin (KEFLEX) 500 MG capsule Take 1 capsule (500 mg total) by mouth 3 (three) times daily. 30 capsule 0  . doxycycline (VIBRAMYCIN) 100 MG capsule Take 1 capsule (100 mg total) by mouth 2 (two) times daily. 20 capsule 0  . naproxen (NAPROSYN) 250 MG tablet Take 500 mg by mouth daily as needed for mild pain.    Marland Kitchen. permethrin (ELIMITE) 5 % cream Apply 1 application topically once. Repeat in 2 weeks if needed.  Apply from neck to toes; leave cream on overnight and wash off in the morning. 60 g 1   No current facility-administered medications for this visit.       Objective:    BP 114/70 mmHg  Pulse 67  Temp(Src) 97.8 F (36.6 C) (Oral)  Resp 18  Ht 5\' 11"  (1.803 m)  Wt 167 lb (75.751 kg)  BMI 23.30 kg/m2  SpO2 100%    Physical Exam  Constitutional: He is oriented to person, place, and time. He appears well-developed and well-nourished. No distress.  HENT:  Head: Normocephalic and atraumatic.  Eyes: Conjunctivae and EOM are normal. Pupils are equal, round, and reactive to light.  Neck: Neck supple.  Musculoskeletal: Normal range of motion.  Neurological: He is alert and oriented to person, place, and time.  Skin: Skin is warm and dry. Rash noted. He is not diaphoretic.  Left foot distal aspect scaling rash mild erythema  with 2 lines of streaking from wound along longitudinal arch without fluctuance or induration.  Diffuse maculopapular rash B lower extremities to hips B with excoriations present. Similar rash along B hands in interdigit spaces with excoriations.   Psychiatric: He has a normal mood and affect. His behavior is normal.  Nursing note and vitals reviewed.    No results found for this or any previous visit.     Assessment & Plan:   1. Scabies   2. Post-traumatic wound infection, initial encounter      1. Wound infection L foot:  New.  Rx for Keflex and Doxycycline provided.  RTC for fever, increasing redness/swelling/pain/drainage.   2.  Scabies: New.  Rx for Elimite provided.  Recommend washing clothes  and linens in hot water.    Meds ordered this encounter  Medications  . doxycycline (VIBRAMYCIN) 100 MG capsule    Sig: Take 1 capsule (100 mg total) by mouth 2 (two) times daily.    Dispense:  20 capsule    Refill:  0  . cephALEXin (KEFLEX) 500 MG capsule    Sig: Take 1 capsule (500 mg total) by mouth 3 (three) times daily.    Dispense:  30 capsule    Refill:  0  . permethrin (ELIMITE) 5 % cream    Sig: Apply 1 application topically once. Repeat in 2 weeks if needed.  Apply from neck to toes; leave cream on overnight and wash off in the morning.    Dispense:  60 g    Refill:  1    No Follow-up on file.     I personally performed the services described in this  documentation, which was scribed in my presence. The recorded information has been reviewed and considered.   Nilda SimmerKristi Larron Armor, M.D.  [facility]  [place] [address] [place], [place]  [id number] [phone] phone [phone] fax

## 2014-01-11 NOTE — Patient Instructions (Signed)
1.  RETURN FOR FEVER>100.0, INCREASING PAIN/REDNESS/SWELLING.   Scabies Scabies are small bugs (mites) that burrow under the skin and cause red bumps and severe itching. These bugs can only be seen with a microscope. Scabies are highly contagious. They can spread easily from person to person by direct contact. They are also spread through sharing clothing or linens that have the scabies mites living in them. It is not unusual for an entire family to become infected through shared towels, clothing, or bedding.  HOME CARE INSTRUCTIONS   Your caregiver may prescribe a cream or lotion to kill the mites. If cream is prescribed, massage the cream into the entire body from the neck to the bottom of both feet. Also massage the cream into the scalp and face if your child is less than 24 year old. Avoid the eyes and mouth. Do not wash your hands after application.  Leave the cream on for 8 to 12 hours. Your child should bathe or shower after the 8 to 12 hour application period. Sometimes it is helpful to apply the cream to your child right before bedtime.  One treatment is usually effective and will eliminate approximately 95% of infestations. For severe cases, your caregiver may decide to repeat the treatment in 1 week. Everyone in your household should be treated with one application of the cream.  New rashes or burrows should not appear within 24 to 48 hours after successful treatment. However, the itching and rash may last for 2 to 4 weeks after successful treatment. Your caregiver may prescribe a medicine to help with the itching or to help the rash go away more quickly.  Scabies can live on clothing or linens for up to 3 days. All of your child's recently used clothing, towels, stuffed toys, and bed linens should be washed in hot water and then dried in a dryer for at least 20 minutes on high heat. Items that cannot be washed should be enclosed in a plastic bag for at least 3 days.  To help relieve  itching, bathe your child in a cool bath or apply cool washcloths to the affected areas.  Your child may return to school after treatment with the prescribed cream. SEEK MEDICAL CARE IF:   The itching persists longer than 4 weeks after treatment.  The rash spreads or becomes infected. Signs of infection include red blisters or yellow-tan crust. Document Released: 02/09/2005 Document Revised: 05/04/2011 Document Reviewed: 06/20/2008 [facility] Patient Information 2015 [employer] [place]. This information is not intended to replace advice given to you by your health care provider. Make sure you discuss any questions you have with your health care provider.

## 2014-01-17 ENCOUNTER — Telehealth: Payer: Self-pay

## 2014-01-17 NOTE — Telephone Encounter (Signed)
Error on previous message. Patient states that he was given doxycycline and cephalexin at his last visit. States that when he first started taking the medications he experienced bad chest pains. Now patient has dry throat and body fatigue. Patient asked "am I sick or is this a side effect of the medicines?"    [phone]

## 2014-01-17 NOTE — Telephone Encounter (Signed)
doxycycline (VIBRAMYCIN) 100 MG capsul

## 2014-01-18 ENCOUNTER — Ambulatory Visit (INDEPENDENT_AMBULATORY_CARE_PROVIDER_SITE_OTHER): Payer: BC Managed Care – PPO | Admitting: Family Medicine

## 2014-01-18 VITALS — BP 120/84 | HR 94 | Temp 97.9°F | Resp 18 | Ht 71.0 in | Wt 166.0 lb

## 2014-01-18 DIAGNOSIS — Z789 Other specified health status: Secondary | ICD-10-CM

## 2014-01-18 DIAGNOSIS — Z889 Allergy status to unspecified drugs, medicaments and biological substances status: Secondary | ICD-10-CM

## 2014-01-18 NOTE — Patient Instructions (Signed)
Stop using the antibiotics now- your foot looks great but let us know if redness or signs of infection come back I would not say you were allergic to either of these antibiotics but you do have intolerance Let us know if you need anything else or if you are not doing ok.   Seek care right away if you have any difficulty breathing, lip or tongue swelling or widespread hives

## 2014-01-18 NOTE — Telephone Encounter (Signed)
Wound infection L foot: New. Rx for Keflex and Doxycycline provided. RTC for fever, increasing redness/swelling/pain/drainage.Called patient, he should come in for these symptoms. How is his foot? He states the chest pains are gone. He indicates he has a fever, he was advised again to come in , he will come in today before 1pm.

## 2014-01-18 NOTE — Progress Notes (Signed)
[facility] [address], [place] [address] [phone]  Date:  01/18/2014   Name:  Don Obrien   DOB:  11-11-1990   MRN:  [id number]  PCP:  No PCP Per Patient    Chief Complaint: Medication Problem   History of Present Illness:  Don Obrien is a 23 y.o. very pleasant male patient who presents with the following:  He was here on 11/19 with cellulitis on his left foot.  He was started on keflex and doxycycline, elimite for possible scabies infection as well.   He noted "chest pains"after taking his abx when he first started taking the medication. However this resolved after the first 2 or 3 doses The other day after he took his keflex after lunch he noted feeling of "a dry throat."  He then took both keflex and doxycycline that night, his throat felt dry and itchy, and he felt like he might have a fever.  He did not take any meds today and feels better overall, but his ears are congested, no fever.   No angioedema.    There are no active problems to display for this patient.   Past Medical History  Diagnosis Date  . Asthma     as child  . Allergy   . Cataract     Past Surgical History  Procedure Laterality Date  . Appendectomy      History  Substance Use Topics  . Smoking status: Never Smoker   . Smokeless tobacco: Not on file  . Alcohol Use: Yes     Comment: occasionally    Family History  Problem Relation Age of Onset  . Mental illness Mother   . Mental illness Father   . Mental illness Sister   . Mental illness Brother   . Cancer Brother   . Mental illness Maternal Grandmother   . Cancer Maternal Grandmother     No Known Allergies  Medication list has been reviewed and updated.  Current Outpatient Prescriptions on File Prior to Visit  Medication Sig Dispense Refill  . cephALEXin (KEFLEX) 500 MG capsule Take 1 capsule (500 mg total) by mouth 3 (three) times daily. 30 capsule 0  . doxycycline (VIBRAMYCIN) 100 MG capsule Take  1 capsule (100 mg total) by mouth 2 (two) times daily. 20 capsule 0  . naproxen (NAPROSYN) 250 MG tablet Take 500 mg by mouth daily as needed for mild pain.    Marland Kitchen. permethrin (ELIMITE) 5 % cream Apply 1 application topically once. Repeat in 2 weeks if needed.  Apply from neck to toes; leave cream on overnight and wash off in the morning. (Patient not taking: Reported on 01/18/2014) 60 g 1   No current facility-administered medications on file prior to visit.    Review of Systems:  As per HPI- otherwise negative.   Physical Examination: Filed Vitals:   01/18/14 1202  BP: 120/84  Pulse: 94  Temp: 97.9 F (36.6 C)  Resp: 18   Filed Vitals:   01/18/14 1202  Height: 5\' 11"  (1.803 m)  Weight: 166 lb (75.297 kg)   Body mass index is 23.16 kg/(m^2). Ideal Body Weight: Weight in (lb) to have BMI = 25: 178.9  GEN: WDWN, NAD, Non-toxic, A & O x 3, looks well HEENT: Atraumatic, Normocephalic. Neck supple. No masses, No LAD.  Bilateral TM wnl, oropharynx normal.  PEERL,EOMI.   Ears and Nose: No external deformity. CV: RRR, No M/G/R. No JVD. No thrill. No extra heart sounds.  PULM: CTA B, no wheezes, crackles, rhonchi. No retractions. No resp. distress. No accessory muscle use. ABD: S, NT, ND, +BS. No rebound. No HSM. EXTR: No c/c/e NEURO Normal gait.  PSYCH: Normally interactive. Conversant. Not depressed or anxious appearing.  Calm demeanor.  The cellulitis on the sole of his foot is resolved No angioedema.  No wheezing or SOB  Assessment and Plan: Medication intolerance  Don Obrien had sx of medication intolerance but I do not believe true allergy.  In any case his cellulitis is cleared up so he can stop both abx; would urge him not to use these in the future unless absolutely necessary  Signed Abbe AmsterdamJessica Aura Bibby, MD

## 2014-11-27 IMAGING — CT CT HEAD W/O CM
1 series · 16 of 28 positions shown, 20 images · non-contrast
Comparison: None.

CLINICAL DATA: Headache.

EXAM:
CT HEAD WITHOUT CONTRAST
TECHNIQUE: Contiguous axial images were obtained from the base of the skull
through the vertex without intravenous contrast.

[Series 2: head 5.0 h30s · axial · 0.45mm/px · z∈[+1308,+1433]mm · 16 of 28 slices shown, 20 images]
[im 2/28  brain]
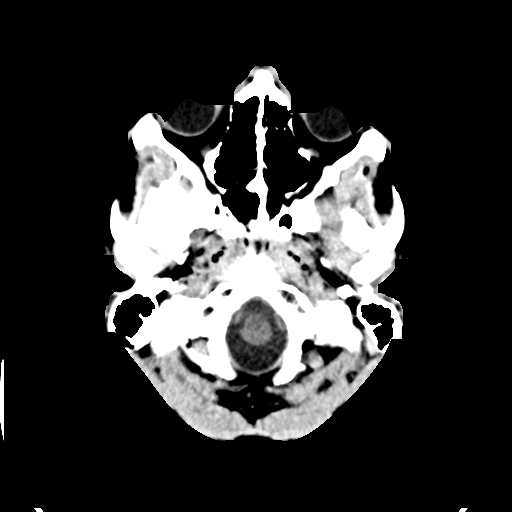
[im 2/28  bone]
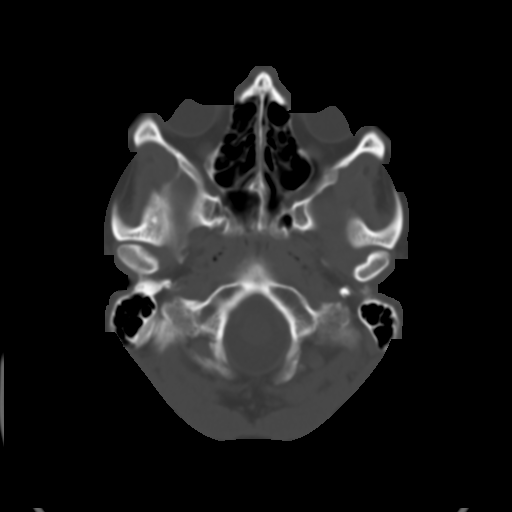
[im 4/28  brain]
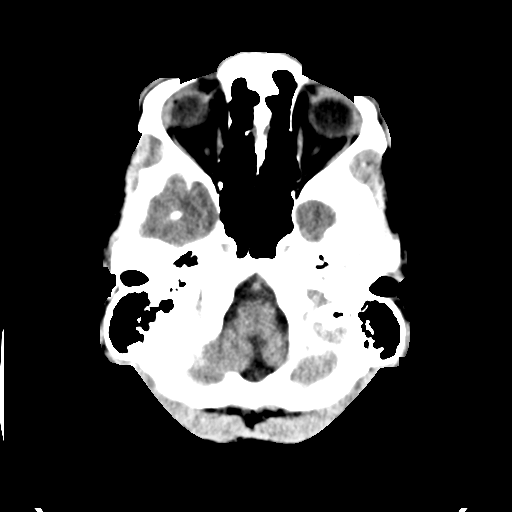
[im 6/28  brain]
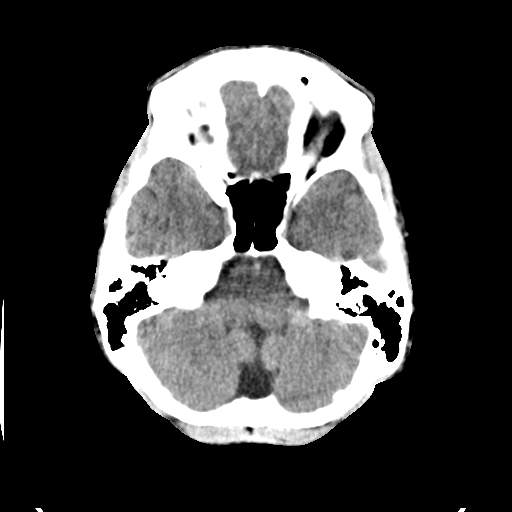
[im 7/28  brain]
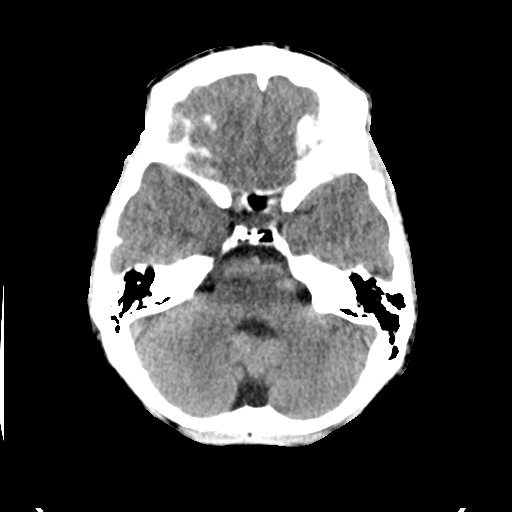
[im 9/28  brain]
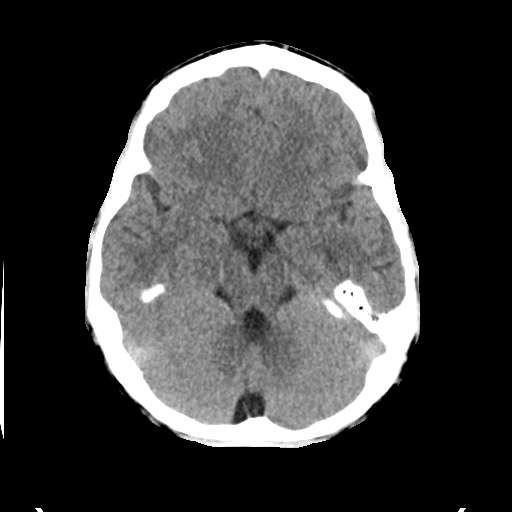
[im 9/28  bone]
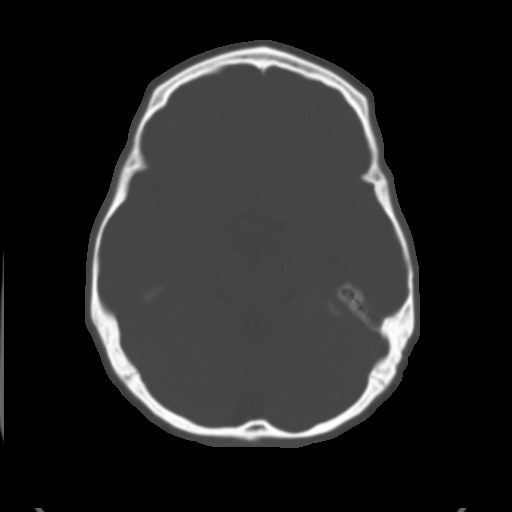
[im 10/28  brain]
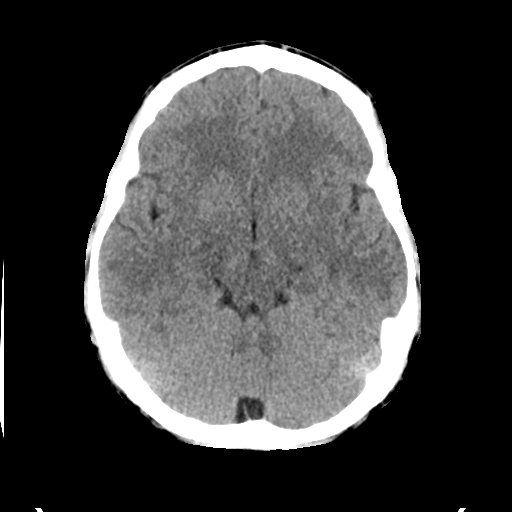
[im 12/28  brain]
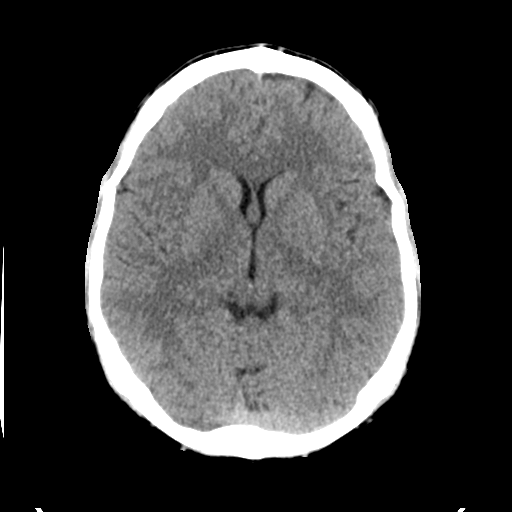
[im 14/28  brain]
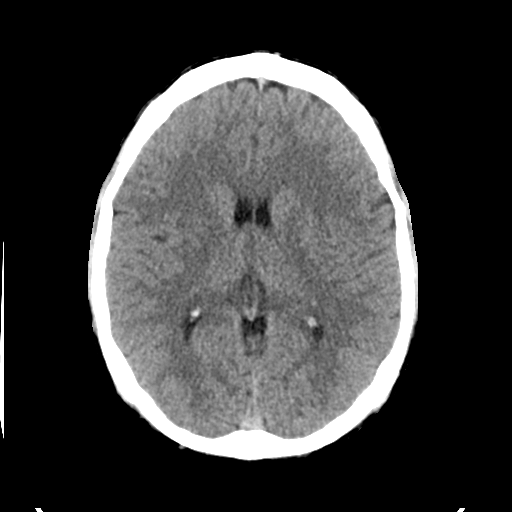
[im 15/28  brain]
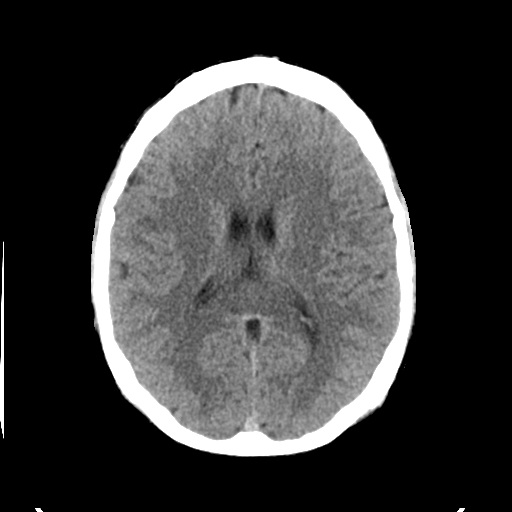
[im 15/28  bone]
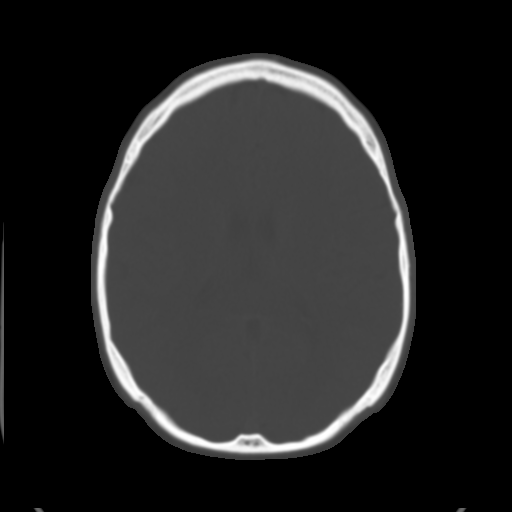
[im 17/28  brain]
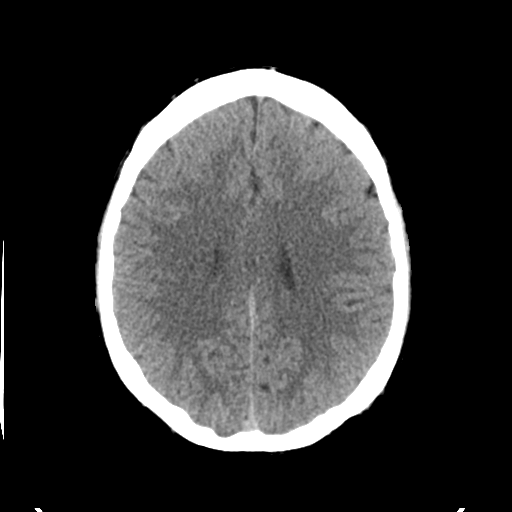
[im 19/28  brain]
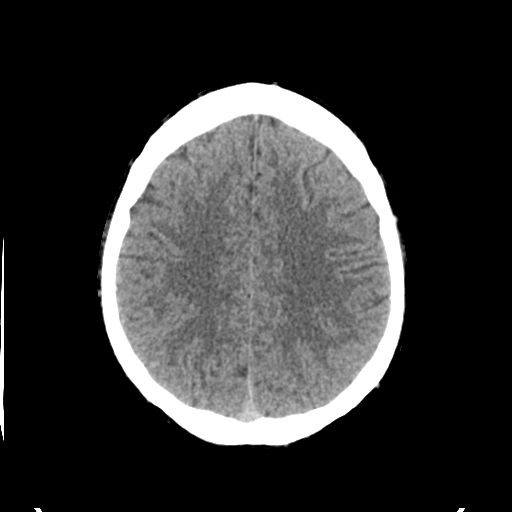
[im 20/28  brain]
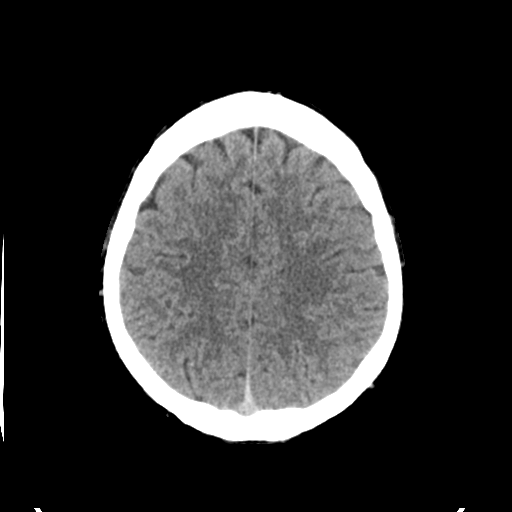
[im 22/28  brain]
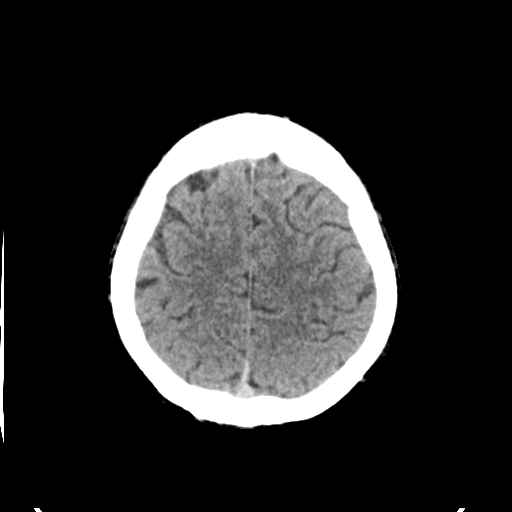
[im 22/28  bone]
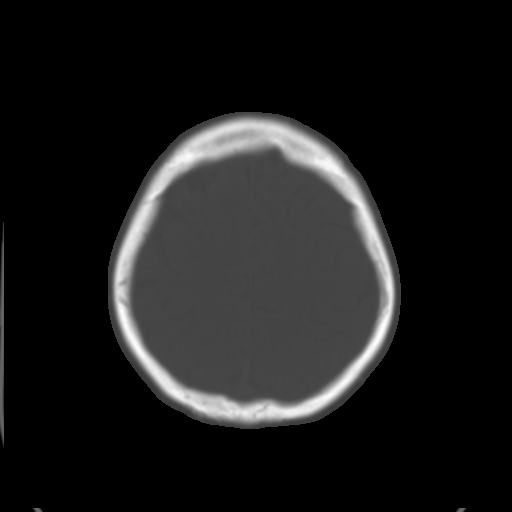
[im 23/28  brain]
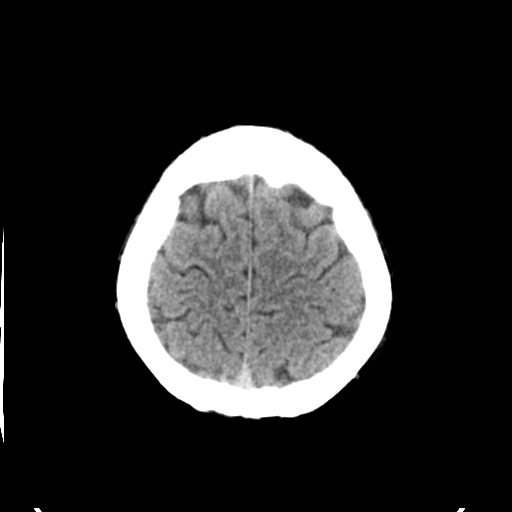
[im 25/28  brain]
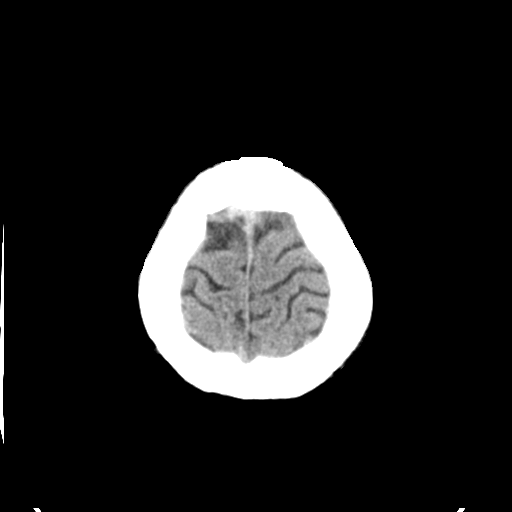
[im 27/28  brain]
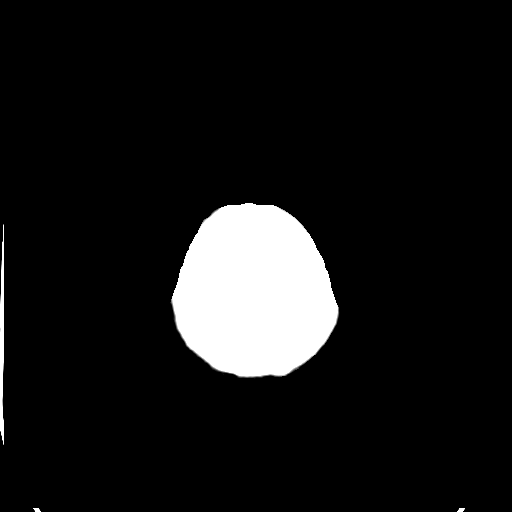

[16 of 28 positions shown; findings below may reference images not displayed]

FINDINGS: The ventricles and sulci are normal. No intraparenchymal hemorrhage,
mass effect nor midline shift. No acute large vascular territory
infarcts.

No abnormal extra-axial fluid collections. Basal cisterns are
patent.

No skull fracture. The included ocular globes and orbital contents
are non-suspicious. The mastoid aircells and included paranasal
sinuses are well-aerated.
IMPRESSION: No acute intracranial process ; normal noncontrast CT of the head.

  By: Jan Kees Ton
